# Patient Record
Sex: Female | Born: 1995 | Race: White | Hispanic: No | Marital: Married | Smoking: Never smoker
Health system: Southern US, Community
[De-identification: ages and names within clinical notes are randomized; demographics above are authoritative.]

## PROBLEM LIST (undated history)

## (undated) DIAGNOSIS — R002 Palpitations: Secondary | ICD-10-CM

## (undated) DIAGNOSIS — J189 Pneumonia, unspecified organism: Secondary | ICD-10-CM

## (undated) DIAGNOSIS — R0789 Other chest pain: Secondary | ICD-10-CM

## (undated) HISTORY — DX: Palpitations: R00.2

## (undated) HISTORY — PX: EYE SURGERY: SHX253

## (undated) HISTORY — DX: Other chest pain: R07.89

---

## 2016-12-05 ENCOUNTER — Emergency Department (HOSPITAL_BASED_OUTPATIENT_CLINIC_OR_DEPARTMENT_OTHER): Payer: Self-pay

## 2016-12-05 ENCOUNTER — Emergency Department (HOSPITAL_BASED_OUTPATIENT_CLINIC_OR_DEPARTMENT_OTHER)
Admission: EM | Admit: 2016-12-05 | Discharge: 2016-12-05 | Disposition: A | Discharge status: Home / Self Care | Payer: Self-pay | Attending: Emergency Medicine | Admitting: Emergency Medicine

## 2016-12-05 ENCOUNTER — Encounter (HOSPITAL_BASED_OUTPATIENT_CLINIC_OR_DEPARTMENT_OTHER): Payer: Self-pay | Admitting: Emergency Medicine

## 2016-12-05 DIAGNOSIS — R0789 Other chest pain: Secondary | ICD-10-CM | POA: Insufficient documentation

## 2016-12-05 DIAGNOSIS — M79605 Pain in left leg: Secondary | ICD-10-CM

## 2016-12-05 DIAGNOSIS — M79604 Pain in right leg: Secondary | ICD-10-CM

## 2016-12-05 DIAGNOSIS — M79662 Pain in left lower leg: Secondary | ICD-10-CM | POA: Insufficient documentation

## 2016-12-05 DIAGNOSIS — R0602 Shortness of breath: Secondary | ICD-10-CM | POA: Insufficient documentation

## 2016-12-05 DIAGNOSIS — F17200 Nicotine dependence, unspecified, uncomplicated: Secondary | ICD-10-CM | POA: Insufficient documentation

## 2016-12-05 DIAGNOSIS — M79661 Pain in right lower leg: Secondary | ICD-10-CM | POA: Insufficient documentation

## 2016-12-05 LAB — CBC WITH DIFFERENTIAL/PLATELET
Basophils Absolute: 0 10*3/uL (ref 0.0–0.1)
Basophils Relative: 0 %
EOS ABS: 0.2 10*3/uL (ref 0.0–0.7)
EOS PCT: 3 %
HCT: 38.7 % (ref 36.0–46.0)
Hemoglobin: 13.5 g/dL (ref 12.0–15.0)
LYMPHS ABS: 2.9 10*3/uL (ref 0.7–4.0)
Lymphocytes Relative: 36 %
MCH: 29.5 pg (ref 26.0–34.0)
MCHC: 34.9 g/dL (ref 30.0–36.0)
MCV: 84.5 fL (ref 78.0–100.0)
MONO ABS: 0.5 10*3/uL (ref 0.1–1.0)
MONOS PCT: 6 %
Neutro Abs: 4.5 10*3/uL (ref 1.7–7.7)
Neutrophils Relative %: 55 %
PLATELETS: 266 10*3/uL (ref 150–400)
RBC: 4.58 MIL/uL (ref 3.87–5.11)
RDW: 13 % (ref 11.5–15.5)
WBC: 8.1 10*3/uL (ref 4.0–10.5)

## 2016-12-05 LAB — COMPREHENSIVE METABOLIC PANEL
ALT: 18 U/L (ref 14–54)
ANION GAP: 7 (ref 5–15)
AST: 23 U/L (ref 15–41)
Albumin: 4 g/dL (ref 3.5–5.0)
Alkaline Phosphatase: 48 U/L (ref 38–126)
BUN: 10 mg/dL (ref 6–20)
CHLORIDE: 106 mmol/L (ref 101–111)
CO2: 24 mmol/L (ref 22–32)
Calcium: 8.9 mg/dL (ref 8.9–10.3)
Creatinine, Ser: 0.61 mg/dL (ref 0.44–1.00)
Glucose, Bld: 109 mg/dL — ABNORMAL HIGH (ref 65–99)
Potassium: 3.6 mmol/L (ref 3.5–5.1)
SODIUM: 137 mmol/L (ref 135–145)
Total Bilirubin: 0.5 mg/dL (ref 0.3–1.2)
Total Protein: 7.1 g/dL (ref 6.5–8.1)

## 2016-12-05 NOTE — ED Provider Notes (Signed)
[facility] Provider Note   CSN: [id number] Arrival date & time: 12/05/16  1335     History   Chief Complaint Chief Complaint  Patient presents with  . Foot Swelling    HPI Crystal Hanson is a 21 y.o. female.  The history is provided by the patient.  Leg Pain   This is a chronic problem. Episode onset: 9 months. The problem occurs constantly. Progression since onset: fluctuating. The pain is present in the right lower leg and left lower leg. The quality of the pain is described as aching. Pain severity now: mild to moderate. Pertinent negatives include no numbness, full range of motion, no stiffness, no tingling and no itching. Associated symptoms comments: Intermittent swelling.   Patient also reporting having intermittent chest pain that she's had for many years. She reports her last chest pain was 3 days ago. These episodes are brief in nature with sharp left lateral chest pain that is nonradiating. Patient also described intermittent dyspnea on exertion for the past several months. No recent fevers, illnesses, infections.  Patient is 9 months postpartum. She denied any prenatal or postnatal complications.  History reviewed. No pertinent past medical history.  There are no active problems to display for this patient.   History reviewed. No pertinent surgical history.  OB History    No data available       Home Medications    Prior to Admission medications   Not on File    Family History History reviewed. No pertinent family history.  Social History Social History  Substance Use Topics  . Smoking status: Current Every Day Smoker  . Smokeless tobacco: Never Used  . Alcohol use No     Comment: rarely     Allergies   Patient has no known allergies.   Review of Systems Review of Systems  Musculoskeletal: Negative for stiffness.  Skin: Negative for itching.  Neurological: Negative for tingling and numbness.  All other systems are reviewed and  are negative for acute change except as noted in the HPI    Physical Exam Updated Vital Signs BP (!) 125/103 (BP Location: Right Arm)   Pulse 88   Temp 98.7 F (37.1 C) (Oral)   Resp 18   Ht 5\' 5"  (1.651 m)   Wt 74.8 kg (165 lb)   SpO2 94%   BMI 27.46 kg/m   Physical Exam  Constitutional: She is oriented to person, place, and time. She appears well-developed and well-nourished. No distress.  HENT:  Head: Normocephalic and atraumatic.  Nose: Nose normal.  Eyes: Conjunctivae and EOM are normal. Pupils are equal, round, and reactive to light. Right eye exhibits no discharge. Left eye exhibits no discharge. No scleral icterus.  Neck: Normal range of motion. Neck supple.  Cardiovascular: Normal rate and regular rhythm.  Exam reveals no gallop and no friction rub.   No murmur heard. Pulmonary/Chest: Effort normal and breath sounds normal. No stridor. No respiratory distress. She has no rales.  Abdominal: Soft. She exhibits no distension. There is no tenderness.  Musculoskeletal: She exhibits no edema or tenderness.  No edema, swelling, or erythema  Neurological: She is alert and oriented to person, place, and time.  Skin: Skin is warm and dry. No rash noted. She is not diaphoretic. No erythema.  Psychiatric: She has a normal mood and affect.  Vitals reviewed.    ED Treatments / Results  Labs (all labs ordered are listed, but only abnormal results are displayed) Labs Reviewed  COMPREHENSIVE  METABOLIC PANEL - Abnormal; Notable for the following:       Result Value   Glucose, Bld 109 (*)    All other components within normal limits  CBC WITH DIFFERENTIAL/PLATELET    EKG  EKG Interpretation  Date/Time:  Tuesday December 05 2016 13:49:22 EDT Ventricular Rate:  87 PR Interval:    QRS Duration: 94 QT Interval:  342 QTC Calculation: 412 R Axis:   52 Text Interpretation:  Sinus rhythm Low voltage, precordial leads Nonspecific T abnormalities, diffuse leads Baseline wander in  lead(s) III aVL No old tracing to compare NO STEMI Confirmed by Drema Pryardama, Rayaan Garguilo [phone]([id number]) on 12/05/2016 1:51:58 PM       Radiology Dg Chest 2 View  Result Date: 12/05/2016 CLINICAL DATA:  Shortness of breath several months. EXAM: CHEST  2 VIEW COMPARISON:  None. FINDINGS: The heart size and mediastinal contours are within normal limits. Both lungs are clear. The visualized skeletal structures are unremarkable. IMPRESSION: No active cardiopulmonary disease. Electronically Signed   By: Elberta Fortisaniel  Boyle M.D.   On: 12/05/2016 14:39    Procedures Procedures (including critical care time)  Medications Ordered in ED Medications - No data to display   Initial Impression / Assessment and Plan / ED Course  I have reviewed the triage vital signs and the nursing notes.  Pertinent labs & imaging results that were available during my care of the patient were reviewed by me and considered in my medical decision making (see chart for details).     1. Leg pain. Pain typically encompasses the entire lower extremity. No evidence to suggest cellulitis or DVT. Labs without evidence of electrolyte derangements. Low suspicion for arterial occlusion. Possibly secondary to sacroiliac syndrome from pregnancy ligament laxity.  2. Chest pain/DOE Highly atypical and inconsistent with ACS. EKG nonspecific T-wave changes. Do not feel that further cardiac work up is necessary at this time.   Low pretest probability for pulmonary embolism. PERC negative. Doubt PE.  Chest x-ray without evidence suggestive of pneumonia, pneumothorax, pneumomediastinum.  No abnormal contour of the mediastinum to suggest dissection. No evidence of acute injuries.   The patient is safe for discharge with strict return precautions.   Final Clinical Impressions(s) / ED Diagnoses   Final diagnoses:  SOB (shortness of breath)  Leg pain, bilateral  Shortness of breath  Atypical chest pain   Disposition: Discharge  Condition: Good  I  have discussed the results, Dx and Tx plan with the patient who expressed understanding and agree(s) with the plan. Discharge instructions discussed at great length. The patient was given strict return precautions who verbalized understanding of the instructions. No further questions at time of discharge.    There are no discharge medications for this patient.   Follow Up: Primary care provider          Nira Connardama, Cristianna Cyr Eduardo, MD 12/05/16 [id number]

## 2016-12-05 NOTE — ED Triage Notes (Signed)
Patient states that she has had intermittent swelling to her bilateral lower extremities and Chest pain intermittently. The patient reports that she is also SOB intermittently

## 2017-03-12 ENCOUNTER — Emergency Department (HOSPITAL_BASED_OUTPATIENT_CLINIC_OR_DEPARTMENT_OTHER): Payer: Self-pay

## 2017-03-12 ENCOUNTER — Encounter (HOSPITAL_BASED_OUTPATIENT_CLINIC_OR_DEPARTMENT_OTHER): Payer: Self-pay

## 2017-03-12 ENCOUNTER — Emergency Department (HOSPITAL_BASED_OUTPATIENT_CLINIC_OR_DEPARTMENT_OTHER)
Admission: EM | Admit: 2017-03-12 | Discharge: 2017-03-12 | Disposition: A | Discharge status: Home / Self Care | Payer: Self-pay | Attending: Emergency Medicine | Admitting: Emergency Medicine

## 2017-03-12 DIAGNOSIS — F17200 Nicotine dependence, unspecified, uncomplicated: Secondary | ICD-10-CM | POA: Insufficient documentation

## 2017-03-12 DIAGNOSIS — J181 Lobar pneumonia, unspecified organism: Secondary | ICD-10-CM | POA: Insufficient documentation

## 2017-03-12 DIAGNOSIS — J189 Pneumonia, unspecified organism: Secondary | ICD-10-CM

## 2017-03-12 MED ORDER — BENZONATATE 100 MG PO CAPS: 100.0000 mg | ORAL_CAPSULE | Freq: Three times a day (TID) | ORAL | 0 refills | Status: DC

## 2017-03-12 MED ORDER — AZITHROMYCIN 250 MG PO TABS: 250.0000 mg | ORAL_TABLET | Freq: Every day | ORAL | 0 refills | Status: DC

## 2017-03-12 MED FILL — AZITHROMYCIN 250 MG TABLET: 250 | 5 days supply | Qty: 6 | Fill #0

## 2017-03-12 MED FILL — BENZONATATE 100 MG CAP: 100 | 7 days supply | Qty: 21 | Fill #0

## 2017-03-12 NOTE — Discharge Instructions (Signed)
Please read attached information. If you experience any new or worsening signs or symptoms please return to the emergency room for evaluation. Please follow-up with your primary care provider or specialist as discussed. Please use medication prescribed only as directed and discontinue taking if you have any concerning signs or symptoms.   °

## 2017-03-12 NOTE — ED Provider Notes (Signed)
[facility]   CSN: [id number] Arrival date & time: 03/12/17  1213     History   Chief Complaint Chief Complaint  Patient presents with  . Cough    HPI Crystal Hanson is a 21 y.o. female.  HPI   21 year old female presents today with complaints of cough.  Patient notes upper respiratory congestion, cough, and fatigue over the last 2 weeks.  Patient notes she is continuing her daily activities including working but notes no improvement in her symptoms.  Patient notes using Mucinex this morning, no other medications.  Patient reports he felt warm yesterday, but did not check her temperature.  She denies any significant past medical history, no chronic health conditions, does not take any daily medications.  She denies any recent significant infections or antibiotic use; no risk factors for hospital-acquired pneumonia.  History reviewed. No pertinent past medical history.  There are no active problems to display for this patient.   History reviewed. No pertinent surgical history.  OB History    No data available     Home Medications    Prior to Admission medications   Medication Sig Start Date End Date Taking? Authorizing Provider  azithromycin (ZITHROMAX) 250 MG tablet Take 1 tablet (250 mg total) by mouth daily. Take first 2 tablets together, then 1 every day until finished. 03/12/17   Ames Hoban, Tinnie Gens, PA-C  benzonatate (TESSALON) 100 MG capsule Take 1 capsule (100 mg total) by mouth every 8 (eight) hours. 03/12/17   Eyvonne Mechanic, PA-C    Family History No family history on file.  Social History Social History  Substance Use Topics  . Smoking status: Current Every Day Smoker  . Smokeless tobacco: Never Used  . Alcohol use No     Comment: rarely     Allergies   Patient has no known allergies.   Review of Systems Review of Systems  All other systems reviewed and are negative.    Physical Exam Updated Vital Signs BP 108/82 (BP  Location: Left Arm)   Pulse 90   Temp 97.9 F (36.6 C) (Oral)   Resp 16   Ht  (1.626 m)   Wt 87.5 kg (193 lb)   LMP 02/19/2017   SpO2 97%   BMI 33.13 kg/m   Physical Exam  Constitutional: She is oriented to person, place, and time. She appears well-developed and well-nourished.  HENT:  Head: Normocephalic and atraumatic.  Eyes: Pupils are equal, round, and reactive to light. Conjunctivae are normal. Right eye exhibits no discharge. Left eye exhibits no discharge. No scleral icterus.  Neck: Normal range of motion. No JVD present. No tracheal deviation present.  Cardiovascular: Normal rate, regular rhythm, normal heart sounds and intact distal pulses.  Exam reveals no gallop and no friction rub.   No murmur heard. Pulmonary/Chest: Effort normal and breath sounds normal. No stridor. No respiratory distress. She has no wheezes. She has no rales. She exhibits no tenderness.  Neurological: She is alert and oriented to person, place, and time. Coordination normal.  Skin: Skin is warm.  Psychiatric: She has a normal mood and affect. Her behavior is normal. Judgment and thought content normal.  Nursing note and vitals reviewed.    ED Treatments / Results  Labs (all labs ordered are listed, but only abnormal results are displayed) Labs Reviewed - No data to display  EKG  EKG Interpretation None       Radiology Dg Chest 2 View  Result Date: 03/12/2017 CLINICAL  DATA:  Cough and chest congestion for the past 3 weeks. Nonsmoker. EXAM: CHEST  2 VIEW COMPARISON:  Chest x-ray of December 05, 2016 FINDINGS: The lungs are adequately inflated. There are increased lung markings in the right lower lobe medially. The left lung is clear. There is no pleural effusion. The heart and pulmonary vascularity are normal. The bony thorax is unremarkable. IMPRESSION: Atelectasis or pneumonia in the right lower lobe. Follow-up radiographs following anticipated antibiotic therapy are recommended if the  patient's symptoms persist. Electronically Signed   By: David  SwazilandJordan M.D.   On: 03/12/2017 12:36    Procedures Procedures (including critical care time)  Medications Ordered in ED Medications - No data to display   Initial Impression / Assessment and Plan / ED Course  I have reviewed the triage vital signs and the nursing notes.  Pertinent labs & imaging results that were available during my care of the patient were reviewed by me and considered in my medical decision making (see chart for details).      Final Clinical Impressions(s) / ED Diagnoses   Final diagnoses:  Community acquired pneumonia of right lower lobe of lung (HCC)    Labs:   Imaging: Chest 2 view  Consults:  Therapeutics:  Discharge Meds: Azithromycin  Assessment/Plan: 21 year old female presents today with likely pneumonia.  She is very well-appearing in no acute distress.  She has clear lung sounds, reassuring vital signs.  No signs of severe systemic illness, she is otherwise healthy with no chronic health conditions that would predispose her to her outcome as an outpatient.  Patient will be treated with azithromycin, cough medication, close follow-up with either her primary care or the emergency room if symptoms persist or worsen.  She verbalized understanding and agreement to today's plan had no further questions or concerns.      New Prescriptions Discharge Medication List as of 03/12/2017  1:42 PM    START taking these medications   Details  azithromycin (ZITHROMAX) 250 MG tablet Take 1 tablet (250 mg total) by mouth daily. Take first 2 tablets together, then 1 every day until finished., Starting Mon 03/12/2017, Print    benzonatate (TESSALON) 100 MG capsule Take 1 capsule (100 mg total) by mouth every 8 (eight) hours., Starting Mon 03/12/2017, Print         Aphrodite Harpenau, ShannonJeffrey, PA-C 03/12/17 1528    Gwyneth SproutPlunkett, Whitney, MD 03/12/17 1556

## 2017-03-12 NOTE — ED Triage Notes (Signed)
Pt reports URI symptoms x 2 weeks. Sts productive cough with mucous. Reports sick contacts at home.

## 2017-06-19 ENCOUNTER — Encounter (HOSPITAL_BASED_OUTPATIENT_CLINIC_OR_DEPARTMENT_OTHER): Payer: Self-pay | Admitting: *Deleted

## 2017-06-19 ENCOUNTER — Emergency Department (HOSPITAL_BASED_OUTPATIENT_CLINIC_OR_DEPARTMENT_OTHER)
Admission: EM | Admit: 2017-06-19 | Discharge: 2017-06-19 | Disposition: A | Discharge status: Home / Self Care | Payer: Self-pay | Attending: Emergency Medicine | Admitting: Emergency Medicine

## 2017-06-19 ENCOUNTER — Other Ambulatory Visit: Payer: Self-pay

## 2017-06-19 DIAGNOSIS — R059 Cough, unspecified: Secondary | ICD-10-CM

## 2017-06-19 DIAGNOSIS — F17200 Nicotine dependence, unspecified, uncomplicated: Secondary | ICD-10-CM | POA: Insufficient documentation

## 2017-06-19 DIAGNOSIS — R05 Cough: Secondary | ICD-10-CM | POA: Insufficient documentation

## 2017-06-19 DIAGNOSIS — J0110 Acute frontal sinusitis, unspecified: Secondary | ICD-10-CM | POA: Insufficient documentation

## 2017-06-19 MED ORDER — LEVOFLOXACIN 750 MG PO TABS: 750.0000 mg | ORAL_TABLET | Freq: Every day | ORAL | 0 refills | Status: AC

## 2017-06-19 MED ORDER — FLUTICASONE PROPIONATE 50 MCG/ACT NA SUSP: 1.0000 | Freq: Every day | NASAL | 0 refills | Status: DC

## 2017-06-19 NOTE — ED Provider Notes (Signed)
[facility] EMERGENCY DEPARTMENT Provider Note   CSN: [id number] Arrival date & time: 06/19/17  1518     History   Chief Complaint Chief Complaint  Patient presents with  . Headache  . Cough    HPI Crystal Hanson is a 21 y.o. female presenting with cough and headache.  Patient states that she has been coughing for the past 3 months.  She was seen and diagnosed with pneumonia the beginning of September.  She took some of the antibiotics, had mild improvement of symptoms.  Cough has persisted since.  She reports worsening of symptoms this past week, including increased tiredness, generalized body aches, and headache.  HA is frontal and described as a pressure/throbbing. She reports chills without fever.  She reports the need to take a deep breath more often than normal, and pain in her chest only when she coughs.  She states she feels similar to how she did 3 months ago.  She denies eye pain, ear pain, sore throat, nausea, vomiting, abdominal pain, urinary symptoms, normal bowel movements.  She denies leg pain or swelling.  She denies history of cancer or DVT.  She is not on OCPs.  She denies recent travel, immobilization, or surgery.  She has no medical history, does not take medications daily.  She denies sick contacts.    HPI  History reviewed. No pertinent past medical history.  There are no active problems to display for this patient.   History reviewed. No pertinent surgical history.  OB History    No data available       Home Medications    Prior to Admission medications   Medication Sig Start Date End Date Taking? Authorizing Provider  azithromycin (ZITHROMAX) 250 MG tablet Take 1 tablet (250 mg total) by mouth daily. Take first 2 tablets together, then 1 every day until finished. 03/12/17   Hedges, Tinnie GensJeffrey, PA-C  benzonatate (TESSALON) 100 MG capsule Take 1 capsule (100 mg total) by mouth every 8 (eight) hours. 03/12/17   Hedges, Tinnie GensJeffrey, PA-C  fluticasone  (FLONASE) 50 MCG/ACT nasal spray Place 1 spray into both nostrils daily. 06/19/17   Trevionne Advani, PA-C  levofloxacin (LEVAQUIN) 750 MG tablet Take 1 tablet (750 mg total) by mouth daily for 7 days. 06/19/17 06/26/17  Stephnie Parlier, PA-C    Family History No family history on file.  Social History Social History   Tobacco Use  . Smoking status: Current Every Day Smoker  . Smokeless tobacco: Never Used  Substance Use Topics  . Alcohol use: No    Comment: rarely  . Drug use: No     Allergies   Patient has no known allergies.   Review of Systems Review of Systems  Constitutional: Positive for chills. Negative for fever.  HENT: Negative for sore throat.   Respiratory: Positive for cough.   Cardiovascular: Positive for chest pain (with coughing). Negative for palpitations and leg swelling.  Gastrointestinal: Negative for abdominal pain, nausea and vomiting.  Neurological: Positive for headaches.     Physical Exam Updated Vital Signs BP 105/72 (BP Location: Right Arm)   Pulse (!) 54   Temp 98.2 F (36.8 C) (Oral)   Resp 16   Ht 5\' 5"  (1.651 m)   Wt 74.8 kg (165 lb)   SpO2 100%   BMI 27.46 kg/m   Physical Exam  Constitutional: She is oriented to person, place, and time. She appears well-developed and well-nourished. No distress.  HENT:  Head: Normocephalic and atraumatic.  Right  Ear: Tympanic membrane, external ear and ear canal normal.  Left Ear: Tympanic membrane, external ear and ear canal normal.  Nose: Mucosal edema present. Right sinus exhibits frontal sinus tenderness. Right sinus exhibits no maxillary sinus tenderness. Left sinus exhibits frontal sinus tenderness. Left sinus exhibits no maxillary sinus tenderness.  Mouth/Throat: Uvula is midline, oropharynx is clear and moist and mucous membranes are normal. No tonsillar exudate.  Nasal mucosal edema.  Tenderness to palpation of frontal sinuses.  OP clear without tonsillar swelling or exudate.  Eyes:  Conjunctivae and EOM are normal. Pupils are equal, round, and reactive to light.  Neck: Normal range of motion.  Cardiovascular: Normal rate, regular rhythm and intact distal pulses.  Pulmonary/Chest: Effort normal and breath sounds normal. No respiratory distress. She has no decreased breath sounds. She has no wheezes. She has no rhonchi. She has no rales. She exhibits tenderness (Tenderness palpation of anterior chest wall).  Pt speaking in full sentences.  Clear lung sounds in all fields  Abdominal: Soft. She exhibits no distension. There is no tenderness.  Musculoskeletal: Normal range of motion.  No leg pain or swelling  Lymphadenopathy:    She has no cervical adenopathy.  Neurological: She is alert and oriented to person, place, and time. No sensory deficit. GCS eye subscore is 4. GCS verbal subscore is 5. GCS motor subscore is 6.  Skin: Skin is warm.  Psychiatric: She has a normal mood and affect.  Nursing note and vitals reviewed.    ED Treatments / Results  Labs (all labs ordered are listed, but only abnormal results are displayed) Labs Reviewed - No data to display  EKG  EKG Interpretation None       Radiology No results found.  Procedures Procedures (including critical care time)  Medications Ordered in ED Medications - No data to display   Initial Impression / Assessment and Plan / ED Course  I have reviewed the triage vital signs and the nursing notes.  Pertinent labs & imaging results that were available during my care of the patient were reviewed by me and considered in my medical decision making (see chart for details).     Patient presenting with 3 month h/o cough and worsening sxs. Physical exam reassuring, patient is afebrile and appears nontoxic.  Pulmonary exam reassuring.  Likely sinusitis, however as pt has had cough for 3 months with initial dx of PNA, will cover for PNA. Discussed option of getting cxr today for definitive dx, and pt elects not  to get one today. Will treat with abx and flonase. As she had recent abx and I am covering for both infections, will give levofloxacin. Pt to follow-up with primary care as needed.  At this time, patient appears safe for discharge.  Return precautions given.  Patient states she understands and agrees to plan.   Final Clinical Impressions(s) / ED Diagnoses   Final diagnoses:  Acute frontal sinusitis, recurrence not specified  Cough    ED Discharge Orders        Ordered    levofloxacin (LEVAQUIN) 750 MG tablet  Daily     06/19/17 1750    fluticasone (FLONASE) 50 MCG/ACT nasal spray  Daily     06/19/17 1750       Trampus Mcquerry, PA-C 06/20/17 0142    Melene PlanFloyd, Dan, DO 06/21/17 1545

## 2017-06-19 NOTE — Discharge Instructions (Signed)
You likely have a bacterial infection. We are treating for sinusitis and pneumonia.  Take antibiotics as prescribed. Take the entire course of antibiotics, even if your symptoms improve.  Use Tylenol or ibuprofen as needed for fevers or body aches. Use Flonase daily for nasal congestion and cough. Make sure you stay well-hydrated with water. Wash your hands frequently to prevent spread of infection. Follow-up with a primary care doctor in 1 week if your symptoms are not improving. Return to the emergency room if you develop chest pain, difficulty breathing, or any new or worsening symptoms.

## 2017-06-19 NOTE — ED Triage Notes (Signed)
Headache and productive cough.

## 2017-07-04 ENCOUNTER — Other Ambulatory Visit: Payer: Self-pay

## 2017-07-04 ENCOUNTER — Encounter (HOSPITAL_BASED_OUTPATIENT_CLINIC_OR_DEPARTMENT_OTHER): Payer: Self-pay

## 2017-07-04 ENCOUNTER — Emergency Department (HOSPITAL_BASED_OUTPATIENT_CLINIC_OR_DEPARTMENT_OTHER): Payer: Self-pay

## 2017-07-04 ENCOUNTER — Emergency Department (HOSPITAL_BASED_OUTPATIENT_CLINIC_OR_DEPARTMENT_OTHER)
Admission: EM | Admit: 2017-07-04 | Discharge: 2017-07-04 | Disposition: A | Discharge status: Home / Self Care | Payer: Self-pay | Attending: Emergency Medicine | Admitting: Emergency Medicine

## 2017-07-04 DIAGNOSIS — B9789 Other viral agents as the cause of diseases classified elsewhere: Secondary | ICD-10-CM | POA: Insufficient documentation

## 2017-07-04 DIAGNOSIS — J069 Acute upper respiratory infection, unspecified: Secondary | ICD-10-CM | POA: Insufficient documentation

## 2017-07-04 HISTORY — DX: Pneumonia, unspecified organism: J18.9

## 2017-07-04 NOTE — ED Notes (Signed)
Pt verbalizes understanding of d/c instructions and denies any further needs at this time. 

## 2017-07-04 NOTE — ED Triage Notes (Signed)
C/o flu like sx x 2-3 days-states she has not felt well since PNE in Sept-NAD-steady gait

## 2017-07-04 NOTE — ED Notes (Signed)
ED Provider at bedside. 

## 2017-07-04 NOTE — ED Provider Notes (Signed)
[facility] EMERGENCY DEPARTMENT Provider Note   CSN: [id number] Arrival date & time: 07/04/17  1843     History   Chief Complaint Chief Complaint  Patient presents with  . Cough    HPI Crystal Hanson is a 22 y.o. female.  22yo F who p/w cough, congestion.  Patient reports 2-3 days of upper respiratory infection symptoms including cough, nasal congestion, sore throat, and malaise.  She has been around her friend who had bronchitis last week.  She denies any associated vomiting, fevers, diarrhea, or urinary symptoms.  No rash.  No travel.  She has not felt well since she was treated for pneumonia back in September.  She has taken Alka-Seltzer cold without much relief.   The history is provided by the patient.  Cough     Past Medical History:  Diagnosis Date  . Pneumonia     There are no active problems to display for this patient.   History reviewed. No pertinent surgical history.  OB History    No data available       Home Medications    Prior to Admission medications   Not on File    Family History No family history on file.  Social History Social History   Tobacco Use  . Smoking status: Never Smoker  . Smokeless tobacco: Never Used  Substance Use Topics  . Alcohol use: No  . Drug use: No     Allergies   Patient has no known allergies.   Review of Systems Review of Systems  Respiratory: Positive for cough.    All other systems reviewed and are negative except that which was mentioned in HPI  Physical Exam Updated Vital Signs BP (!) 98/59 (BP Location: Right Arm)   Pulse 74   Temp 98 F (36.7 C) (Oral)   Resp 18   Ht 5\' 5"  (1.651 m)   Wt 77.1 kg (170 lb)   LMP 06/09/2017   SpO2 99%   BMI 28.29 kg/m   Physical Exam  Constitutional: She is oriented to person, place, and time. She appears well-developed and well-nourished. No distress.  HENT:  Head: Normocephalic and atraumatic.  Mouth/Throat: Oropharynx is clear and  moist. No oropharyngeal exudate.  Moist mucous membranes  Eyes: Conjunctivae are normal.  Neck: Normal range of motion. Neck supple.  Cardiovascular: Normal rate, regular rhythm and normal heart sounds.  No murmur heard. Pulmonary/Chest: Effort normal and breath sounds normal.  Abdominal: Soft. Bowel sounds are normal. She exhibits no distension. There is no tenderness.  Musculoskeletal: She exhibits no edema.  Neurological: She is alert and oriented to person, place, and time.  Fluent speech  Skin: Skin is warm and dry.  Psychiatric: She has a normal mood and affect. Judgment normal.  Nursing note and vitals reviewed.    ED Treatments / Results  Labs (all labs ordered are listed, but only abnormal results are displayed) Labs Reviewed - No data to display  EKG  EKG Interpretation None       Radiology Dg Chest 2 View  Result Date: 07/04/2017 CLINICAL DATA:  Productive cough, shortness of breath, stabbing LEFT side chest pain, headache and congestion for 1 week, had pneumonia in September EXAM: CHEST  2 VIEW COMPARISON:  03/12/2017 FINDINGS: Normal heart size, mediastinal contours, and pulmonary vascularity. Lungs clear. No pleural effusion or pneumothorax. Bones unremarkable. IMPRESSION: Normal exam. Electronically Signed   By: Ulyses SouthwardMark  Boles M.D.   On: 07/04/2017 19:21    Procedures Procedures (including  critical care time)  Medications Ordered in ED Medications - No data to display   Initial Impression / Assessment and Plan / ED Course  I have reviewed the triage vital signs and the nursing notes.  Pertinent imaging results that were available during my care of the patient were reviewed by me and considered in my medical decision making (see chart for details).     Sx c/w viral URI. VS reassuring, normal O2 sat, normal CXR. Clear breath sounds. Discussed supportive measures and return precautions.  Final Clinical Impressions(s) / ED Diagnoses   Final diagnoses:    Viral URI with cough    ED Discharge Orders    None       Little, Ambrose Finland, MD 07/04/17 2120

## 2017-10-24 DIAGNOSIS — W57XXXA Bitten or stung by nonvenomous insect and other nonvenomous arthropods, initial encounter: Secondary | ICD-10-CM | POA: Insufficient documentation

## 2017-10-24 DIAGNOSIS — Y929 Unspecified place or not applicable: Secondary | ICD-10-CM | POA: Insufficient documentation

## 2017-10-24 DIAGNOSIS — Y999 Unspecified external cause status: Secondary | ICD-10-CM | POA: Insufficient documentation

## 2017-10-24 DIAGNOSIS — S70361A Insect bite (nonvenomous), right thigh, initial encounter: Secondary | ICD-10-CM | POA: Insufficient documentation

## 2017-10-24 DIAGNOSIS — Y939 Activity, unspecified: Secondary | ICD-10-CM | POA: Insufficient documentation

## 2017-10-25 ENCOUNTER — Other Ambulatory Visit: Payer: Self-pay

## 2017-10-25 ENCOUNTER — Encounter (HOSPITAL_BASED_OUTPATIENT_CLINIC_OR_DEPARTMENT_OTHER): Payer: Self-pay | Admitting: *Deleted

## 2017-10-25 ENCOUNTER — Emergency Department (HOSPITAL_BASED_OUTPATIENT_CLINIC_OR_DEPARTMENT_OTHER)
Admission: EM | Admit: 2017-10-25 | Discharge: 2017-10-25 | Disposition: A | Discharge status: Home / Self Care | Payer: Self-pay | Attending: Emergency Medicine | Admitting: Emergency Medicine

## 2017-10-25 DIAGNOSIS — W57XXXA Bitten or stung by nonvenomous insect and other nonvenomous arthropods, initial encounter: Secondary | ICD-10-CM

## 2017-10-25 DIAGNOSIS — S70361A Insect bite (nonvenomous), right thigh, initial encounter: Secondary | ICD-10-CM

## 2017-10-25 MED ORDER — DOXYCYCLINE HYCLATE 100 MG PO CAPS: 100.0000 mg | ORAL_CAPSULE | Freq: Two times a day (BID) | ORAL | 0 refills | Status: DC

## 2017-10-25 MED ORDER — DOXYCYCLINE HYCLATE 100 MG PO TABS
100.0000 mg | ORAL_TABLET | Freq: Once | ORAL | Status: AC
  Administered 2017-10-25: 100 mg via ORAL
  Filled 2017-10-25: qty 1

## 2017-10-25 NOTE — ED Provider Notes (Signed)
   [facility] Provider Note: Lowella DellJ. Lane Ned Kakar, MD, FACEP  CSN: [id number] MRN: [id number] ARRIVAL: 10/24/17 at 2349 ROOM: MH10/MH10   CHIEF COMPLAINT  Insect Bite   HISTORY OF PRESENT ILLNESS  10/25/17 1:58 AM Crystal Hanson is a 22 y.o. female who thinks she was bitten on her anterior right thigh by an insect 4 days ago.  Symptoms began with itching and burning.  She now has a central erythematous area with surrounding ecchymosis.  There is moderate pain and tenderness associated with it.  She had a low-grade fever earlier in the week but none recently.  She denies nausea or vomiting.   Past Medical History:  Diagnosis Date  . Pneumonia     Past Surgical History:  Procedure Laterality Date  . EYE SURGERY      History reviewed. No pertinent family history.  Social History   Tobacco Use  . Smoking status: Never Smoker  . Smokeless tobacco: Never Used  Substance Use Topics  . Alcohol use: No  . Drug use: No    Prior to Admission medications   Not on File    Allergies Patient has no known allergies.   REVIEW OF SYSTEMS  Negative except as noted here or in the History of Present Illness.   PHYSICAL EXAMINATION  Initial Vital Signs Blood pressure 117/75, pulse 96, temperature 98.2 F (36.8 C), temperature source Oral, resp. rate 18, height 5\' 6"  (1.676 m), weight 94.9 kg (209 lb 4.8 oz), last menstrual period 10/17/2017, SpO2 100 %.  Examination General: Well-developed, well-nourished female in no acute distress; appearance consistent with age of record HENT: normocephalic; atraumatic Eyes: pupils equal, round and reactive to light; extraocular muscles intact Neck: supple Heart: regular rate and rhythm Lungs: clear to auscultation bilaterally Abdomen: soft; nondistended; nontender; bowel sounds present Extremities: No deformity; full range of motion; pulses normal Neurologic: Awake, alert and oriented; motor function intact in all extremities and  symmetric; no facial droop Skin: Warm and dry; erythematous, mildly crusted lesion right anterior thigh with surrounding ecchymosis:     Psychiatric: Normal mood and affect   RESULTS  Summary of this visit's results, reviewed by myself:   EKG Interpretation  Date/Time:    Ventricular Rate:    PR Interval:    QRS Duration:   QT Interval:    QTC Calculation:   R Axis:     Text Interpretation:        Laboratory Studies: No results found for this or any previous visit (from the past 24 hour(s)). Imaging Studies: No results found.  ED COURSE and MDM  Nursing notes and initial vitals signs, including pulse oximetry, reviewed.  Vitals:   10/24/17 2355 10/24/17 2356  BP: 117/75   Pulse: 96   Resp: 18   Temp: 98.2 F (36.8 C)   TempSrc: Oral   SpO2: 100%   Weight:  94.9 kg (209 lb 4.8 oz)  Height:  5\' 6"  (1.676 m)   The patient did not see what allegedly bit her.  The patient's lesion is not classic for the erythema chronicum migrans of Lyme disease but is concerning.  We will go ahead and treat for Lyme disease with doxycycline.  This [facility] also [facility].  PROCEDURES    ED DIAGNOSES     ICD-10-CM   1. Insect bite of right thigh, initial encounter S70.361A    [email or web address]        Dresden Ament, MD 10/25/17 [id number]

## 2017-10-25 NOTE — ED Triage Notes (Signed)
Pt c/o insect bite to right upper thigh x 2 days , redness and bruising  noted today

## 2018-08-12 ENCOUNTER — Other Ambulatory Visit: Payer: Self-pay

## 2018-08-12 ENCOUNTER — Encounter (HOSPITAL_BASED_OUTPATIENT_CLINIC_OR_DEPARTMENT_OTHER): Payer: Self-pay

## 2018-08-12 ENCOUNTER — Emergency Department (HOSPITAL_BASED_OUTPATIENT_CLINIC_OR_DEPARTMENT_OTHER)
Admission: EM | Admit: 2018-08-12 | Discharge: 2018-08-12 | Disposition: A | Discharge status: Home / Self Care | Payer: 59 | Attending: Emergency Medicine | Admitting: Emergency Medicine

## 2018-08-12 DIAGNOSIS — R112 Nausea with vomiting, unspecified: Secondary | ICD-10-CM | POA: Insufficient documentation

## 2018-08-12 DIAGNOSIS — Z3202 Encounter for pregnancy test, result negative: Secondary | ICD-10-CM | POA: Diagnosis not present

## 2018-08-12 DIAGNOSIS — R1084 Generalized abdominal pain: Secondary | ICD-10-CM | POA: Insufficient documentation

## 2018-08-12 DIAGNOSIS — R197 Diarrhea, unspecified: Secondary | ICD-10-CM | POA: Insufficient documentation

## 2018-08-12 LAB — CBC WITH DIFFERENTIAL/PLATELET
Abs Immature Granulocytes: 0.02 10*3/uL (ref 0.00–0.07)
Basophils Absolute: 0 10*3/uL (ref 0.0–0.1)
Basophils Relative: 0 %
EOS ABS: 0.2 10*3/uL (ref 0.0–0.5)
EOS PCT: 3 %
HEMATOCRIT: 42.9 % (ref 36.0–46.0)
Hemoglobin: 13.7 g/dL (ref 12.0–15.0)
Immature Granulocytes: 0 %
LYMPHS ABS: 1.8 10*3/uL (ref 0.7–4.0)
Lymphocytes Relative: 23 %
MCH: 27.5 pg (ref 26.0–34.0)
MCHC: 31.9 g/dL (ref 30.0–36.0)
MCV: 86.1 fL (ref 80.0–100.0)
MONOS PCT: 6 %
Monocytes Absolute: 0.4 10*3/uL (ref 0.1–1.0)
Neutro Abs: 5.1 10*3/uL (ref 1.7–7.7)
Neutrophils Relative %: 68 %
PLATELETS: 308 10*3/uL (ref 150–400)
RBC: 4.98 MIL/uL (ref 3.87–5.11)
RDW: 12.4 % (ref 11.5–15.5)
WBC: 7.6 10*3/uL (ref 4.0–10.5)
nRBC: 0 % (ref 0.0–0.2)

## 2018-08-12 LAB — URINALYSIS, ROUTINE W REFLEX MICROSCOPIC
Bilirubin Urine: NEGATIVE
GLUCOSE, UA: NEGATIVE mg/dL
KETONES UR: NEGATIVE mg/dL
Nitrite: POSITIVE — AB
PROTEIN: NEGATIVE mg/dL
Specific Gravity, Urine: >1.03 — ABNORMAL HIGH (ref 1.005–1.030)
pH: 6 (ref 5.0–8.0)

## 2018-08-12 LAB — COMPREHENSIVE METABOLIC PANEL
ALBUMIN: 3.9 g/dL (ref 3.5–5.0)
ALK PHOS: 63 U/L (ref 38–126)
ALT: 27 U/L (ref 0–44)
ANION GAP: 6 (ref 5–15)
AST: 20 U/L (ref 15–41)
BILIRUBIN TOTAL: 0.6 mg/dL (ref 0.3–1.2)
BUN: 10 mg/dL (ref 6–20)
CALCIUM: 8.6 mg/dL — AB (ref 8.9–10.3)
CO2: 23 mmol/L (ref 22–32)
Chloride: 107 mmol/L (ref 98–111)
Creatinine, Ser: 0.7 mg/dL (ref 0.44–1.00)
GFR calc Af Amer: >60 mL/min (ref >60)
Glucose, Bld: 116 mg/dL — ABNORMAL HIGH (ref 70–99)
Potassium: 3.5 mmol/L (ref 3.5–5.1)
Sodium: 136 mmol/L (ref 135–145)
TOTAL PROTEIN: 7.4 g/dL (ref 6.5–8.1)

## 2018-08-12 LAB — URINALYSIS, MICROSCOPIC (REFLEX)

## 2018-08-12 LAB — PREGNANCY, URINE: PREG TEST UR: NEGATIVE

## 2018-08-12 LAB — LIPASE, BLOOD: Lipase: 27 U/L (ref 11–51)

## 2018-08-12 MED ORDER — SODIUM CHLORIDE 0.9 % IV BOLUS
1000.0000 mL | Freq: Once | INTRAVENOUS | Status: AC
  Administered 2018-08-12: 1000 mL via INTRAVENOUS

## 2018-08-12 MED ORDER — ONDANSETRON HCL 4 MG/2ML IJ SOLN
4.0000 mg | Freq: Once | INTRAMUSCULAR | Status: AC
  Administered 2018-08-12: 4 mg via INTRAVENOUS
  Filled 2018-08-12: qty 2

## 2018-08-12 MED ORDER — ONDANSETRON 4 MG PO TBDP: 4.0000 mg | ORAL_TABLET | Freq: Once | ORAL | Status: DC | PRN

## 2018-08-12 MED ORDER — ONDANSETRON 4 MG PO TBDP: 4.0000 mg | ORAL_TABLET | Freq: Three times a day (TID) | ORAL | 0 refills | Status: AC | PRN

## 2018-08-12 MED ORDER — DICYCLOMINE HCL 20 MG PO TABS: 20.0000 mg | ORAL_TABLET | Freq: Two times a day (BID) | ORAL | 0 refills | Status: AC

## 2018-08-12 NOTE — ED Notes (Signed)
Coke given for po challenge.  

## 2018-08-12 NOTE — Discharge Instructions (Signed)
Nausea, Vomiting, and Diarrhea ° °Hand washing: Wash your hands throughout the day, but especially before and after touching the face, using the restroom, sneezing, coughing, or touching surfaces that have been coughed or sneezed upon. °Hydration: Symptoms will be intensified and complicated by dehydration. Dehydration can also extend the duration of symptoms. Drink plenty of fluids and get plenty of rest. You should be drinking at least half a liter of water an hour to stay hydrated. Electrolyte drinks (ex. Gatorade, Powerade, Pedialyte) are also encouraged. You should be drinking enough fluids to make your urine light yellow, almost clear. If this is not the case, you are not drinking enough water. °Please note that some of the treatments indicated below will not be effective if you are not adequately hydrated. °Diet: Please concentrate on hydration, however, you may introduce food slowly.  Start with a clear liquid diet, progressed to a full liquid diet, and then bland solids as you are able. °Pain or fever: Ibuprofen, Naproxen, or Tylenol for pain or fever.  °Nausea/vomiting: Use the Zofran for nausea or vomiting. °Diarrhea: May use medications such as loperamide (Imodium) or Bismuth subsalicylate (Pepto-Bismol). °Bentyl: This medication is what is known as an antispasmodic and is intended to help reduce abdominal discomfort. °Follow-up: Follow-up with a primary care provider on this matter. °Return: Return should you develop a fever, bloody diarrhea, increased abdominal pain, uncontrolled vomiting, or any other major concerns. ° °For prescription assistance, may try using prescription discount sites or apps, such as goodrx.com °

## 2018-08-12 NOTE — ED Triage Notes (Signed)
Pt arrives via POV. Pt began feeling nauseous last night 19:30. Has been around people with "the stomach bug". Multiple episodes of vomiting. Three episodes of diarrhea this am. Endorses chills.

## 2018-08-12 NOTE — ED Notes (Signed)
ED Provider at bedside. 

## 2018-08-12 NOTE — ED Provider Notes (Signed)
[facility] EMERGENCY DEPARTMENT Provider Note   CSN: [id number] Arrival date & time: 08/12/18  1009     History   Chief Complaint Chief Complaint  Patient presents with  . Nausea  . Emesis    HPI Crystal Hanson is a 23 y.o. female.  HPI  Crystal Hanson is a 23 y.o. female, patient with no pertinent past medical history, presenting to the ED with vomiting beginning this morning around midnight.  Nonbloody and nonbilious.  Preceded by nausea yesterday.  Diarrhea beginning a few hours ago. Mild, generalized abdominal cramping typically prior to a bowel movement. She has had contact with people at work with similar symptoms.  No recent antibiotic use.  No time spent in the outdoors, traveling, or in foreign countries. LMP about 2-3 weeks ago, but she states she has irregular menstrual cycles. Denies fever, persistent abdominal pain, urinary symptoms, abnormal vaginal discharge/bleeding, hematochezia/melena, cough or other upper respiratory symptoms, or any other complaints.   Past Medical History:  Diagnosis Date  . Pneumonia     There are no active problems to display for this patient.   Past Surgical History:  Procedure Laterality Date  . EYE SURGERY       OB History   No obstetric history on file.      Home Medications    Prior to Admission medications   Medication Sig Start Date End Date Taking? Authorizing Provider  dicyclomine (BENTYL) 20 MG tablet Take 1 tablet (20 mg total) by mouth 2 (two) times daily. 08/12/18   Courtland Coppa C, PA-C  ondansetron (ZOFRAN ODT) 4 MG disintegrating tablet Take 1 tablet (4 mg total) by mouth every 8 (eight) hours as needed for nausea or vomiting. 08/12/18   Latravis Grine, Hillard DankerShawn C, PA-C    Family History History reviewed. No pertinent family history.  Social History Social History   Tobacco Use  . Smoking status: Never Smoker  . Smokeless tobacco: Never Used  Substance Use Topics  . Alcohol use: Yes    Comment: socially  .  Drug use: No     Allergies   Patient has no known allergies.   Review of Systems Review of Systems  Constitutional: Negative for chills and fever.  HENT: Negative for congestion.   Respiratory: Negative for cough and shortness of breath.   Gastrointestinal: Positive for diarrhea, nausea and vomiting. Negative for abdominal distention, abdominal pain and blood in stool.  Genitourinary: Negative for dysuria, frequency and hematuria.  Musculoskeletal: Negative for back pain.  Neurological: Negative for syncope and weakness.  All other systems reviewed and are negative.    Physical Exam Updated Vital Signs BP 121/75 (BP Location: Right Arm)   Pulse (!) 106   Temp 98.5 F (36.9 C) (Oral)   Resp 20   Ht 5\' 6"  (1.676 m)   Wt 98.7 kg   SpO2 100%   BMI 35.12 kg/m   Physical Exam Vitals signs and nursing note reviewed.  Constitutional:      General: She is not in acute distress.    Appearance: She is well-developed. She is not diaphoretic.  HENT:     Head: Normocephalic and atraumatic.     Mouth/Throat:     Mouth: Mucous membranes are moist.     Pharynx: Oropharynx is clear.  Eyes:     Conjunctiva/sclera: Conjunctivae normal.  Neck:     Musculoskeletal: Neck supple.  Cardiovascular:     Rate and Rhythm: Normal rate and regular rhythm.     Pulses:  Normal pulses.     Heart sounds: Normal heart sounds.     Comments: Pulse rate about 96 on my exam. Pulmonary:     Effort: Pulmonary effort is normal. No respiratory distress.     Breath sounds: Normal breath sounds.  Abdominal:     Palpations: Abdomen is soft.     Tenderness: There is no abdominal tenderness. There is no guarding.  Musculoskeletal:     Right lower leg: No edema.     Left lower leg: No edema.  Lymphadenopathy:     Cervical: No cervical adenopathy.  Skin:    General: Skin is warm and dry.  Neurological:     Mental Status: She is alert.  Psychiatric:        Mood and Affect: Mood and affect normal.          Speech: Speech normal.        Behavior: Behavior normal.      ED Treatments / Results  Labs (all labs ordered are listed, but only abnormal results are displayed) Labs Reviewed  COMPREHENSIVE METABOLIC PANEL - Abnormal; Notable for the following components:      Result Value   Glucose, Bld 116 (*)    Calcium 8.6 (*)    All other components within normal limits  URINALYSIS, ROUTINE W REFLEX MICROSCOPIC - Abnormal; Notable for the following components:   APPearance CLOUDY (*)    Specific Gravity, Urine >1.030 (*)    Hgb urine dipstick MODERATE (*)    Nitrite POSITIVE (*)    Leukocytes, UA SMALL (*)    All other components within normal limits  URINALYSIS, MICROSCOPIC (REFLEX) - Abnormal; Notable for the following components:   Bacteria, UA MANY (*)    All other components within normal limits  URINE CULTURE  LIPASE, BLOOD  PREGNANCY, URINE  CBC WITH DIFFERENTIAL/PLATELET    EKG None  Radiology No results found.  Procedures Procedures (including critical care time)  Medications Ordered in ED Medications  ondansetron (ZOFRAN-ODT) disintegrating tablet 4 mg (has no administration in time range)  sodium chloride 0.9 % bolus 1,000 mL ( Intravenous Stopped 08/12/18 1220)  ondansetron (ZOFRAN) injection 4 mg (4 mg Intravenous Given 08/12/18 1110)  ondansetron (ZOFRAN) injection 4 mg (4 mg Intravenous Given 08/12/18 1242)  sodium chloride 0.9 % bolus 1,000 mL ( Intravenous Stopped 08/12/18 1347)     Initial Impression / Assessment and Plan / ED Course  I have reviewed the triage vital signs and the nursing notes.  Pertinent labs & imaging results that were available during my care of the patient were reviewed by me and considered in my medical decision making (see chart for details).  Clinical Course as of Aug 13 1403  Mon Aug 12, 2018  1131 Patient denies urinary symptoms.  Urinalysis, Routine w reflex microscopic(!) [SJ]  1236 Patient vomited oral fluid challenge.    [SJ]  1355 RN states patient is now tolerating PO fluids.   [SJ]    Clinical Course User Index [SJ] Essynce Munsch C, PA-C    Patient presents with nausea, vomiting, and diarrhea. Patient is nontoxic appearing, afebrile, not tachycardic on my exam, not tachypneic, not hypotensive, maintains excellent SPO2 on room air, and is in no apparent distress.  No leukocytosis.  Benign abdominal exam.  Lab results overall reassuring.  No indication for imaging at this time. Tolerating PO fluids at discharge. The patient was given instructions for home care as well as return precautions. Patient voices understanding of these instructions,  accepts the plan, and is comfortable with discharge.   Vitals:   08/12/18 1038 08/12/18 1225  BP: 121/75 109/68  Pulse: (!) 106 82  Resp: 20 16  Temp: 98.5 F (36.9 C)   TempSrc: Oral   SpO2: 100% 100%  Weight: 98.7 kg   Height: 5\' 6"  (1.676 m)      Final Clinical Impressions(s) / ED Diagnoses   Final diagnoses:  Nausea vomiting and diarrhea    ED Discharge Orders         Ordered    ondansetron (ZOFRAN ODT) 4 MG disintegrating tablet  Every 8 hours PRN     08/12/18 1058    dicyclomine (BENTYL) 20 MG tablet  2 times daily     08/12/18 [address], PA-C 08/12/18 1406    Benjiman Core, MD 08/12/18 [phone]

## 2018-08-12 NOTE — ED Notes (Signed)
Pt ambulated to RR unassisted 

## 2018-08-12 NOTE — ED Notes (Signed)
ED Provider at bedside discussing test results and dispo plan of care. 

## 2018-08-12 NOTE — ED Notes (Signed)
Sprite given for 2nd PO challenge.

## 2018-08-14 LAB — URINE CULTURE: Culture: >=100000 — AB

## 2018-08-15 ENCOUNTER — Telehealth: Payer: Self-pay | Admitting: *Deleted

## 2018-08-15 ENCOUNTER — Telehealth: Payer: Self-pay

## 2018-08-15 NOTE — Telephone Encounter (Signed)
Post ED Visit - Positive Culture Follow-up: Unsuccessful Patient Follow-up  Culture assessed and recommendations reviewed by:  []  Enzo Bi, Pharm.D. []  Celedonio Miyamoto, Pharm.D., BCPS AQ-ID []  Garvin Fila, Pharm.D., BCPS []  Georgina Pillion, Pharm.D., BCPS []  Coates, [place].D., BCPS, AAHIVP []  Estella Husk, Pharm.D., BCPS, AAHIVP []  Sherlynn Carbon, PharmD []  Pollyann Samples, PharmD, BCPS Mervyn Gay Pharm D Positive urine culture Symptom check  May need abx [x]  Patient discharged without antimicrobial prescription and treatment is now indicated []  Organism is resistant to prescribed ED discharge antimicrobial []  Patient with positive blood cultures   Unable to contact patient after 3 attempts, letter will be sent to address on file  Jerry Caras 08/15/2018, 11:44 AM

## 2018-08-15 NOTE — Progress Notes (Signed)
ED Antimicrobial Stewardship Positive Culture Follow Up   Aribella Naples is an 23 y.o. female who presented to [facility] on 08/12/2018 with a chief complaint of  Chief Complaint  Patient presents with  . Nausea  . Emesis    Recent Results (from the past 720 hour(s))  Urine culture     Status: Abnormal   Collection Time: 08/12/18 11:07 AM  Result Value Ref Range Status   Specimen Description   Final    URINE, CLEAN CATCH Performed at [facility], [address] [place], [place] [address]    Special Requests   Final    NONE Performed at [facility], [address] [place], [place] [address]    Culture >=100,000 COLONIES/mL ESCHERICHIA COLI (A)  Final   Report Status 08/14/2018 FINAL  Final   Organism ID, Bacteria ESCHERICHIA COLI (A)  Final      Susceptibility   Escherichia coli - MIC*    AMPICILLIN >=32 RESISTANT Resistant     CEFAZOLIN <=4 SENSITIVE Sensitive     CEFTRIAXONE <=1 SENSITIVE Sensitive     CIPROFLOXACIN <=0.25 SENSITIVE Sensitive     GENTAMICIN <=1 SENSITIVE Sensitive     IMIPENEM <=0.25 SENSITIVE Sensitive     NITROFURANTOIN <=16 SENSITIVE Sensitive     TRIMETH/SULFA >=320 RESISTANT Resistant     AMPICILLIN/SULBACTAM 16 INTERMEDIATE Intermediate     PIP/TAZO <=4 SENSITIVE Sensitive     Extended ESBL NEGATIVE Sensitive     * >=100,000 COLONIES/mL ESCHERICHIA COLI    ED Provider: Burna Forts, PA-C  PA asked that we call patient for a symptom check.  If still symptomatic then call in a prescription for Keflex 250 mg po bid x 5 days. If asymptomatic then no intervention.  Addendum: Attempted to call patient at [phone] at 0930 on 2/13, but got no answer.   Tera Mater 08/15/2018, 9:46 AM Clinical Pharmacist Monday - Friday phone -  [phone] Saturday - Sunday phone - [phone]

## 2018-09-25 IMAGING — DX DG CHEST 2V
2 series · 2 of 2 positions shown · non-contrast
Comparison: 03/12/2017

CLINICAL DATA: Productive cough, shortness of breath, stabbing LEFT
side chest pain, headache and congestion for 1 week, had pneumonia
in [REDACTED]

EXAM:
CHEST  2 VIEW

[chest pa]
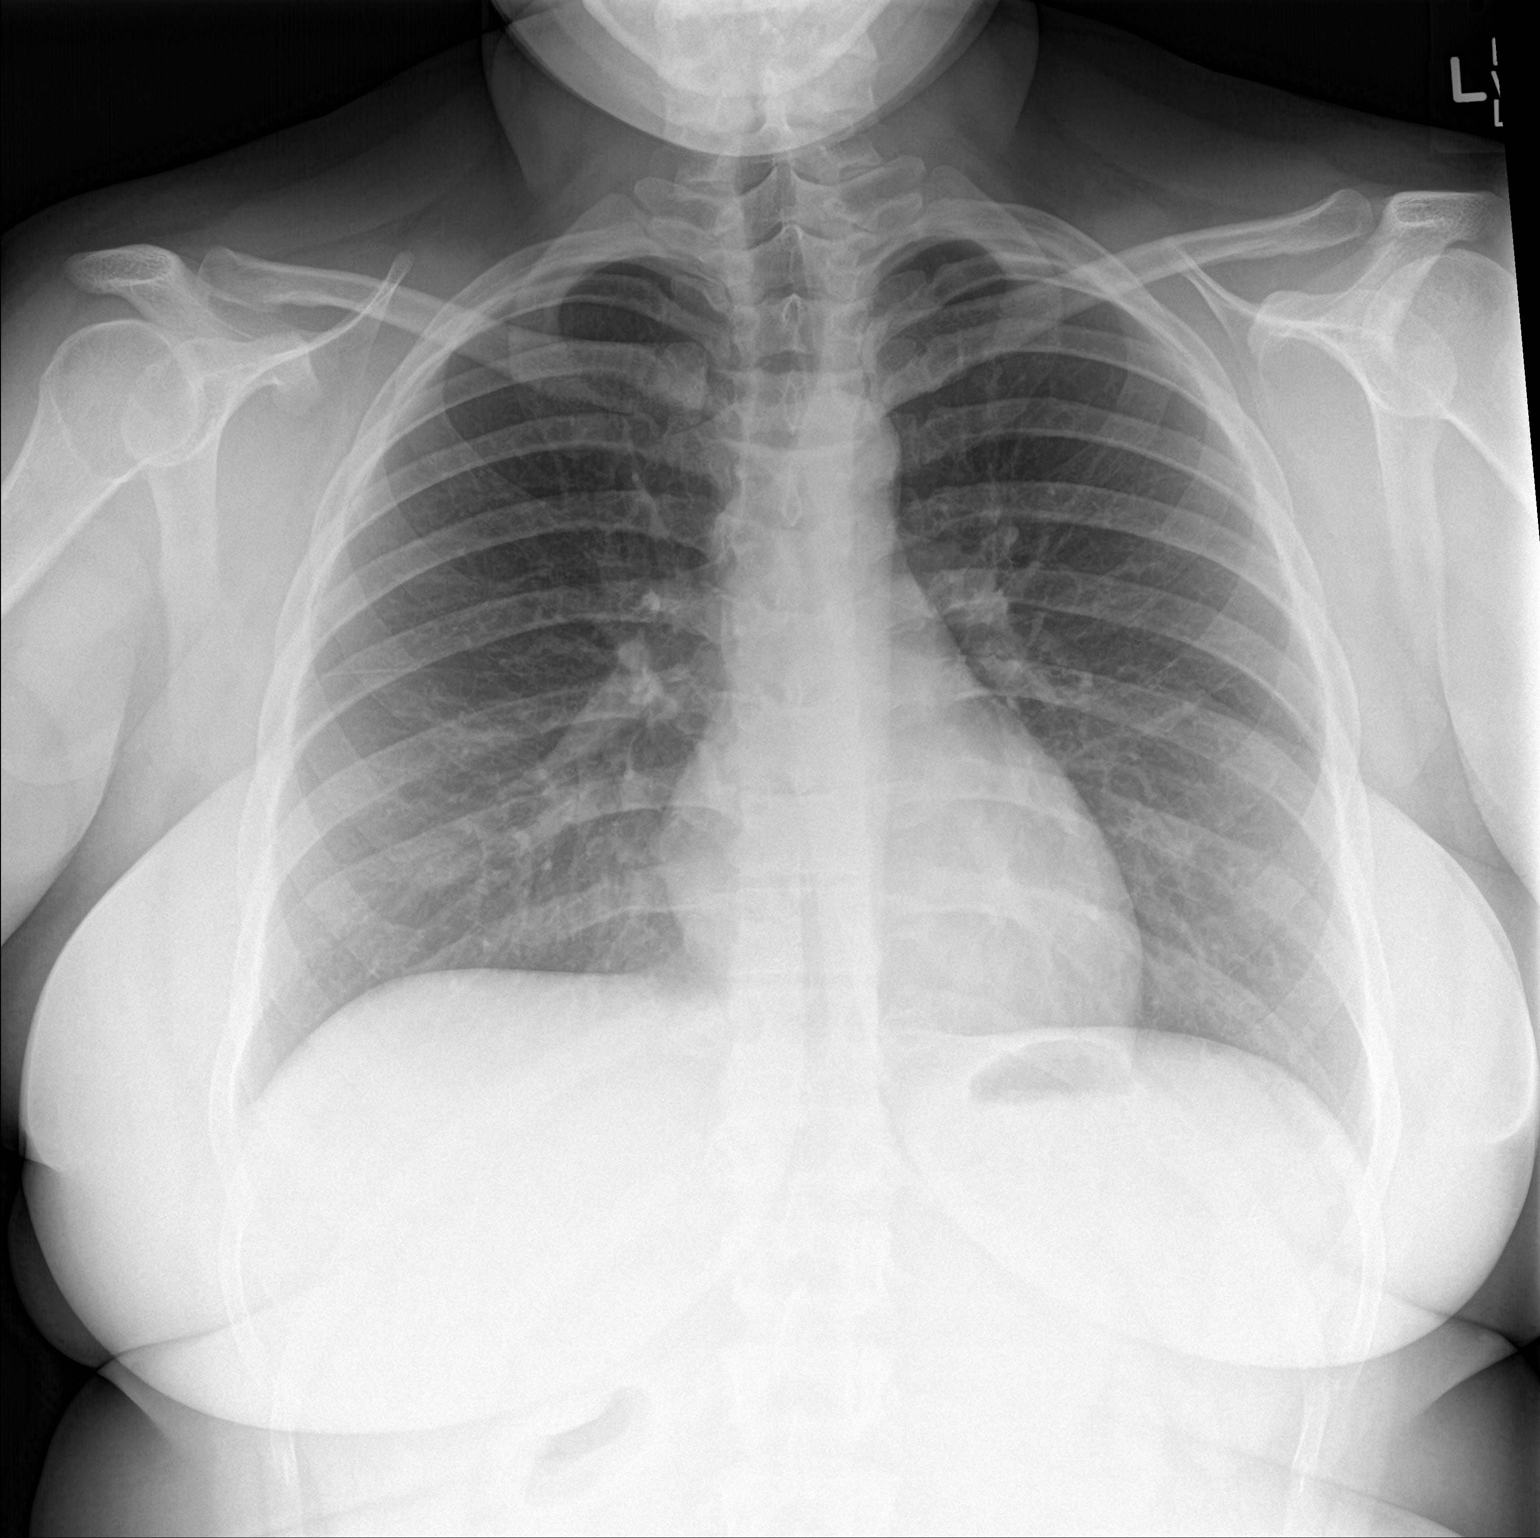

[chest lat]
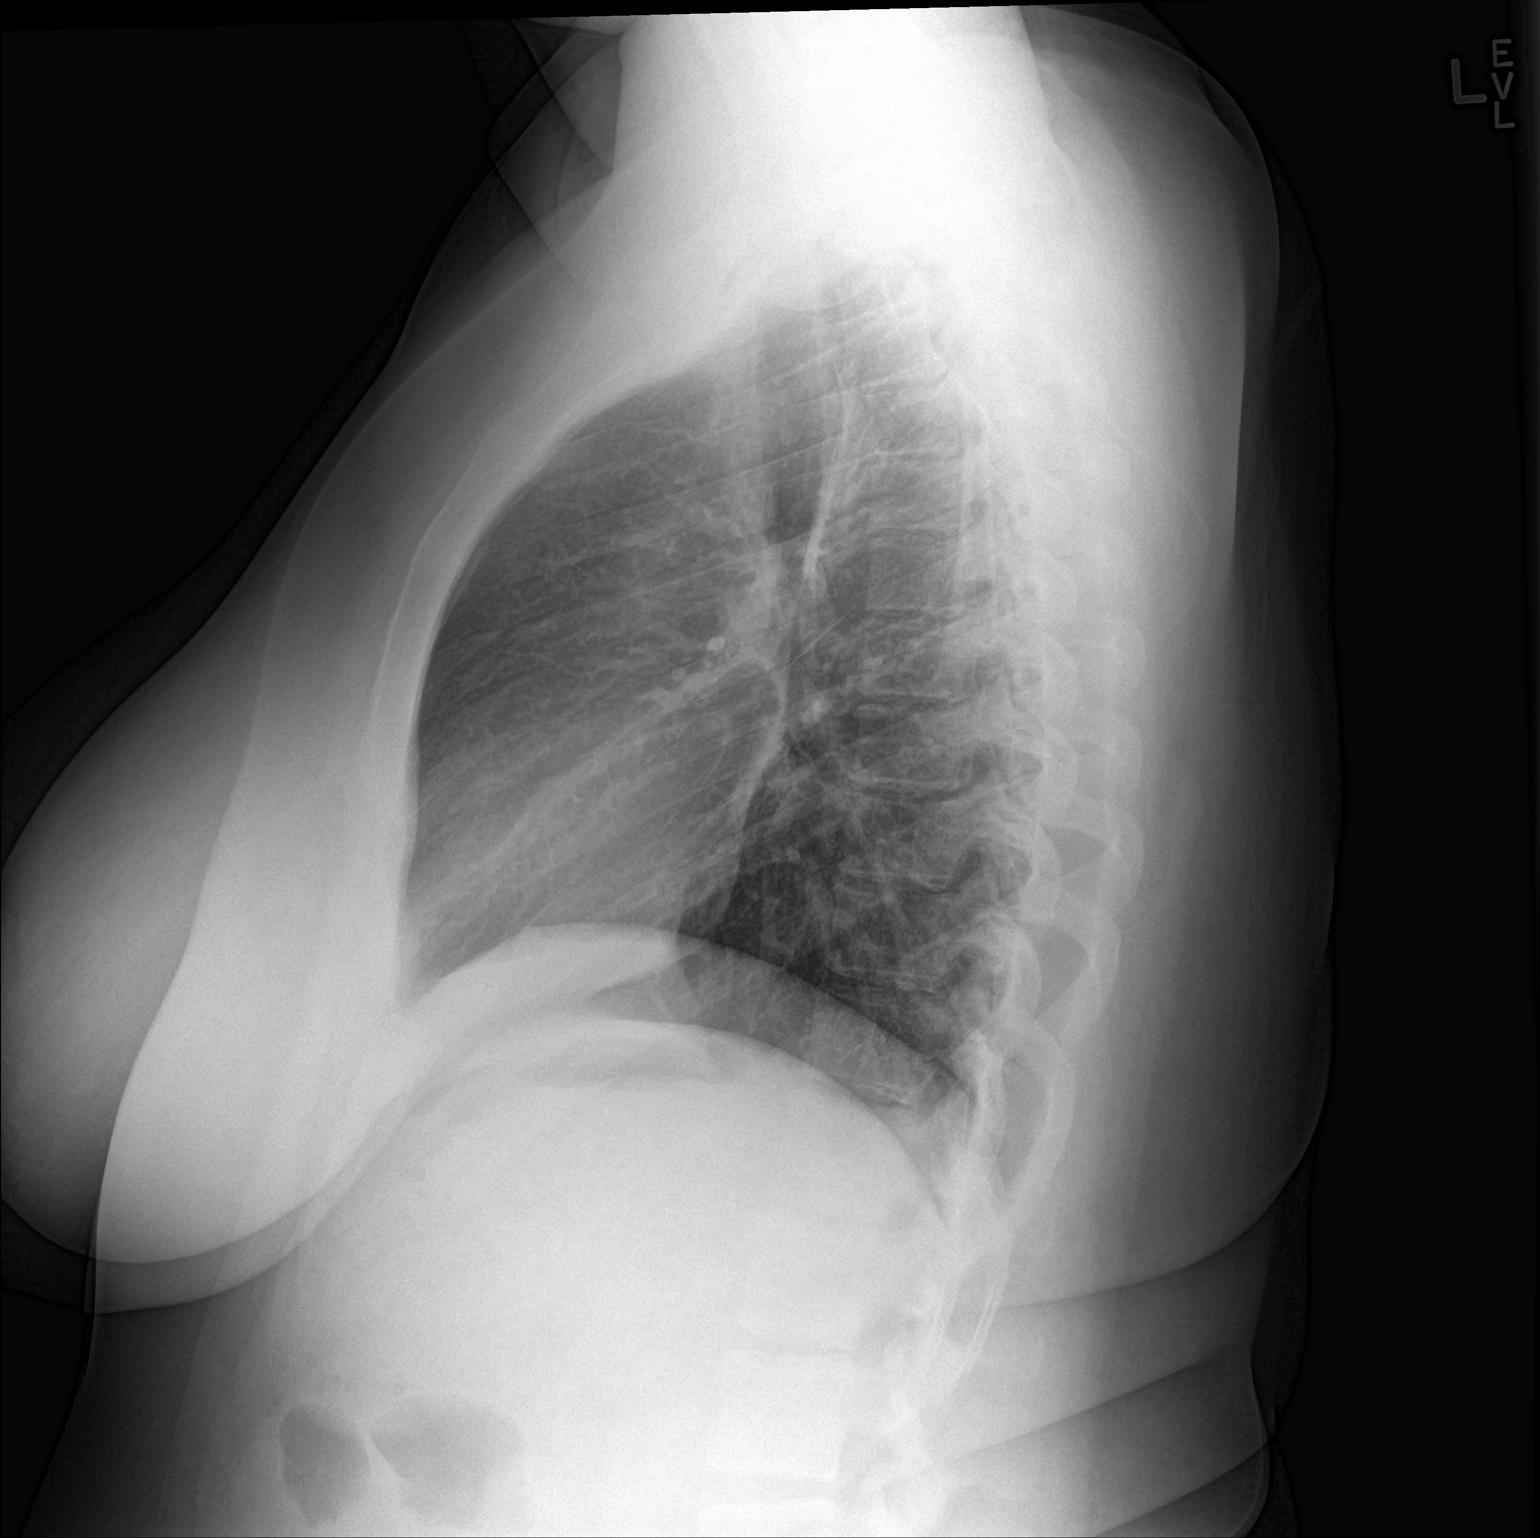

[2 of 2 positions shown; findings below may reference images not displayed]

FINDINGS: Normal heart size, mediastinal contours, and pulmonary vascularity.

Lungs clear.

No pleural effusion or pneumothorax.

Bones unremarkable.
IMPRESSION: Normal exam.

## 2019-04-08 ENCOUNTER — Other Ambulatory Visit: Payer: Self-pay

## 2019-04-08 DIAGNOSIS — Z20822 Contact with and (suspected) exposure to covid-19: Secondary | ICD-10-CM

## 2019-04-10 LAB — NOVEL CORONAVIRUS, NAA: SARS-CoV-2, NAA: NOT DETECTED

## 2019-08-06 IMAGING — CR DG CHEST 2V
2 series · 2 of 2 positions shown · non-contrast
Comparison: Chest x-ray of December 05, 2016

CLINICAL DATA: Cough and chest congestion for the past 3 weeks.
Nonsmoker.

EXAM:
CHEST  2 VIEW

[w chest pa]
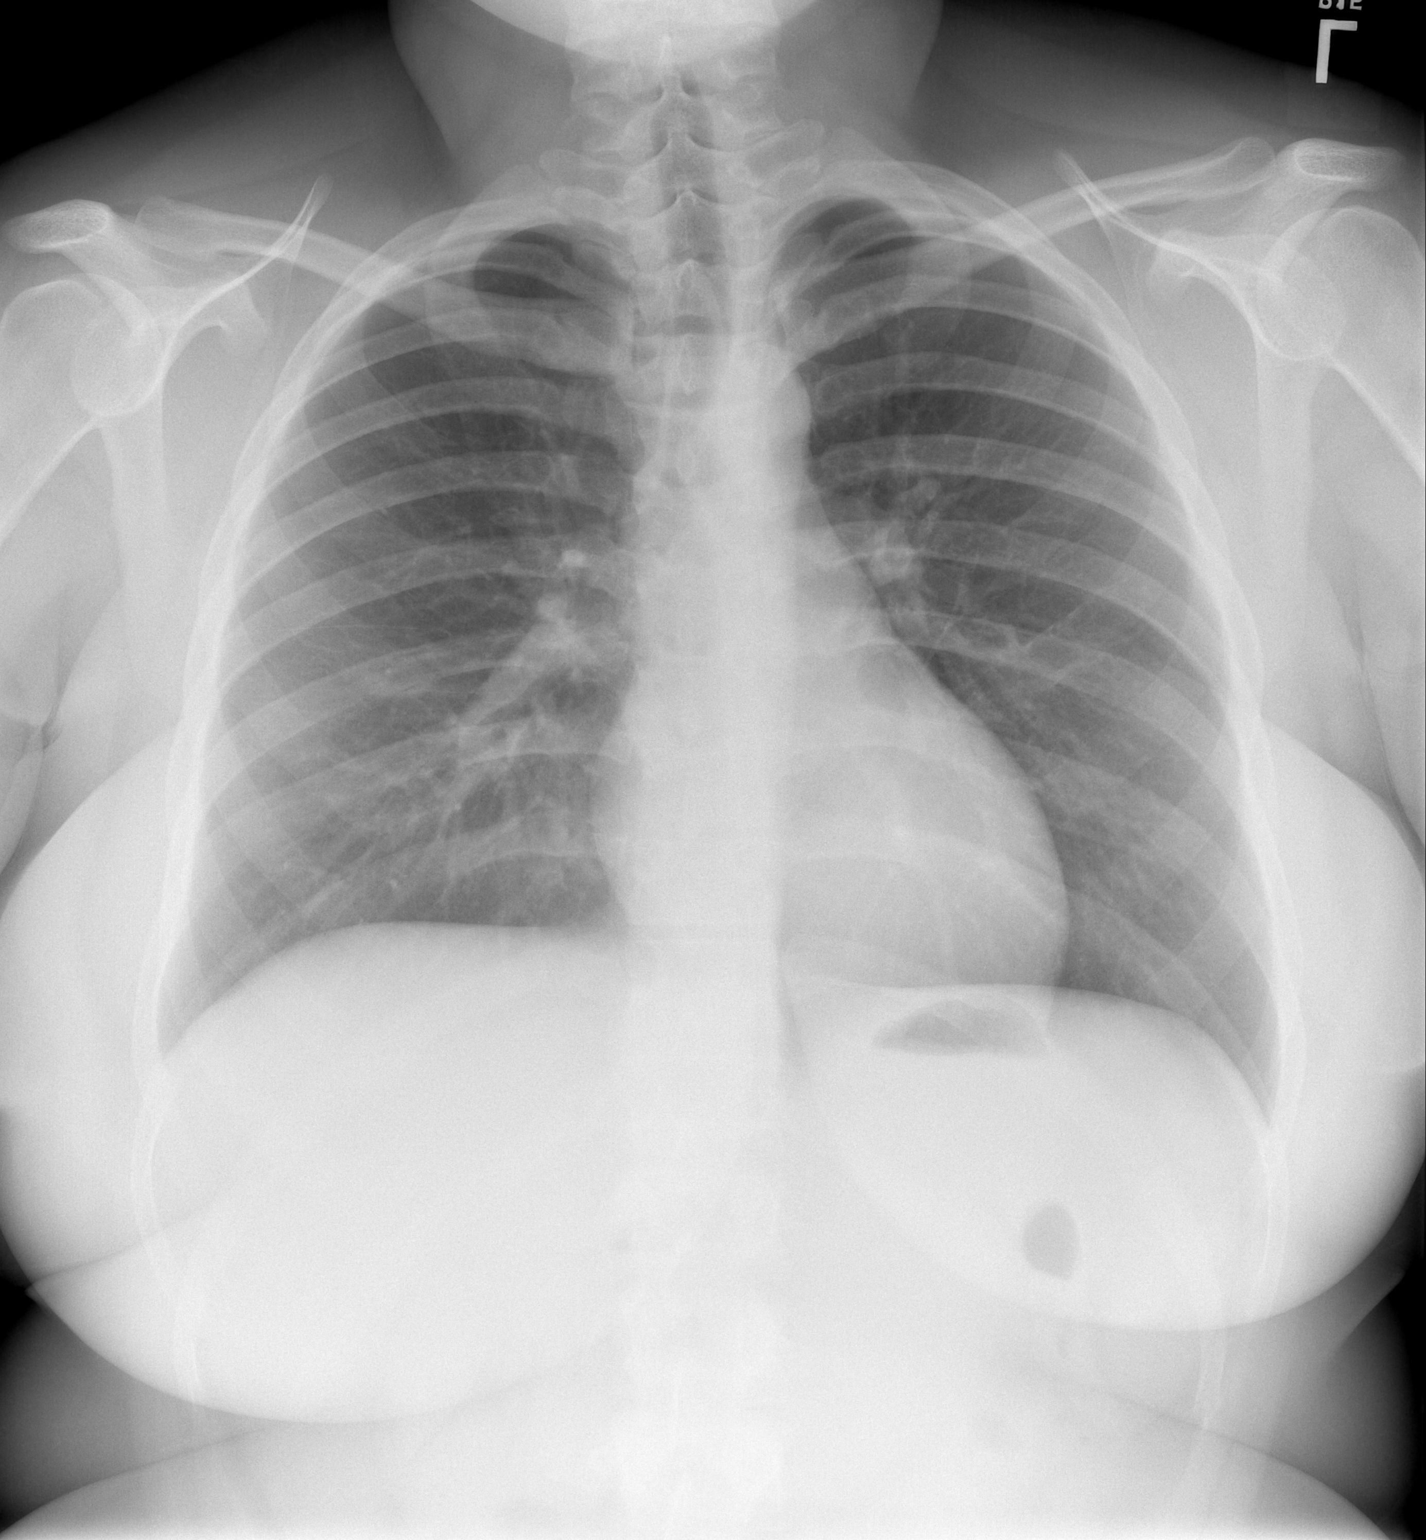

[w chest lat]
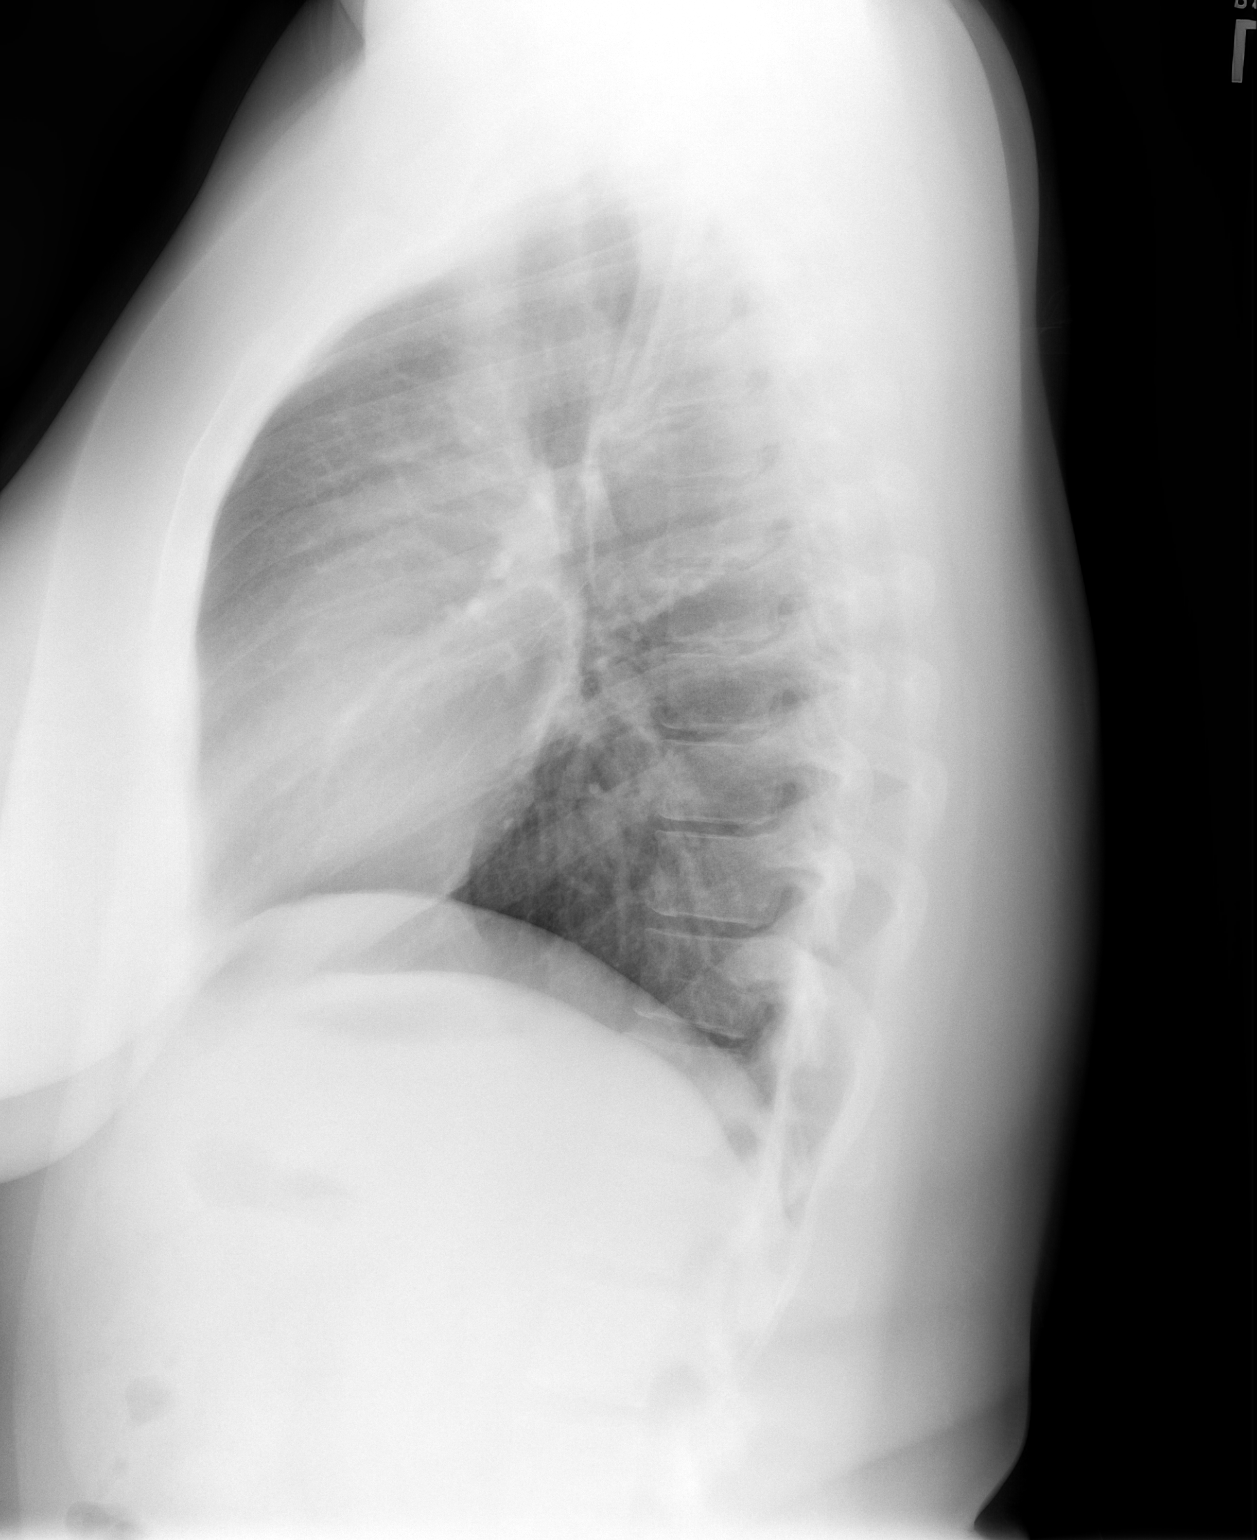

[2 of 2 positions shown; findings below may reference images not displayed]

FINDINGS: The lungs are adequately inflated. There are increased lung markings
in the right lower lobe medially. The left lung is clear. There is
no pleural effusion. The heart and pulmonary vascularity are normal.
The bony thorax is unremarkable.
IMPRESSION: Atelectasis or pneumonia in the right lower lobe. Follow-up
radiographs following anticipated antibiotic therapy are recommended
if the patient's symptoms persist.

## 2023-01-15 ENCOUNTER — Emergency Department (HOSPITAL_BASED_OUTPATIENT_CLINIC_OR_DEPARTMENT_OTHER)
Admission: EM | Admit: 2023-01-15 | Discharge: 2023-01-15 | Disposition: A | Discharge status: Home / Self Care | Payer: Self-pay | Attending: Emergency Medicine | Admitting: Emergency Medicine

## 2023-01-15 ENCOUNTER — Emergency Department (HOSPITAL_BASED_OUTPATIENT_CLINIC_OR_DEPARTMENT_OTHER): Payer: Self-pay

## 2023-01-15 ENCOUNTER — Other Ambulatory Visit: Payer: Self-pay

## 2023-01-15 DIAGNOSIS — R002 Palpitations: Secondary | ICD-10-CM

## 2023-01-15 DIAGNOSIS — R2243 Localized swelling, mass and lump, lower limb, bilateral: Secondary | ICD-10-CM | POA: Insufficient documentation

## 2023-01-15 DIAGNOSIS — E876 Hypokalemia: Secondary | ICD-10-CM | POA: Insufficient documentation

## 2023-01-15 DIAGNOSIS — R0789 Other chest pain: Secondary | ICD-10-CM | POA: Insufficient documentation

## 2023-01-15 DIAGNOSIS — R0602 Shortness of breath: Secondary | ICD-10-CM | POA: Insufficient documentation

## 2023-01-15 LAB — TSH: TSH: 3.28 u[IU]/mL (ref 0.350–4.500)

## 2023-01-15 LAB — T4, FREE: Free T4: 0.69 ng/dL (ref 0.61–1.12)

## 2023-01-15 LAB — CBC WITH DIFFERENTIAL/PLATELET
Abs Immature Granulocytes: 0.04 10*3/uL (ref 0.00–0.07)
Basophils Absolute: 0 10*3/uL (ref 0.0–0.1)
Basophils Relative: 0 %
Eosinophils Absolute: 0.2 10*3/uL (ref 0.0–0.5)
Eosinophils Relative: 2 %
HCT: 40.5 % (ref 36.0–46.0)
Hemoglobin: 14.1 g/dL (ref 12.0–15.0)
Immature Granulocytes: 0 %
Lymphocytes Relative: 35 %
Lymphs Abs: 3.6 10*3/uL (ref 0.7–4.0)
MCH: 29.1 pg (ref 26.0–34.0)
MCHC: 34.8 g/dL (ref 30.0–36.0)
MCV: 83.7 fL (ref 80.0–100.0)
Monocytes Absolute: 0.8 10*3/uL (ref 0.1–1.0)
Monocytes Relative: 7 %
Neutro Abs: 5.6 10*3/uL (ref 1.7–7.7)
Neutrophils Relative %: 56 %
Platelets: 367 10*3/uL (ref 150–400)
RBC: 4.84 MIL/uL (ref 3.87–5.11)
RDW: 12.7 % (ref 11.5–15.5)
WBC: 10.3 10*3/uL (ref 4.0–10.5)
nRBC: 0 % (ref 0.0–0.2)

## 2023-01-15 LAB — COMPREHENSIVE METABOLIC PANEL
ALT: 55 U/L — ABNORMAL HIGH (ref 0–44)
AST: 33 U/L (ref 15–41)
Albumin: 3.9 g/dL (ref 3.5–5.0)
Alkaline Phosphatase: 51 U/L (ref 38–126)
Anion gap: 10 (ref 5–15)
BUN: 10 mg/dL (ref 6–20)
CO2: 21 mmol/L — ABNORMAL LOW (ref 22–32)
Calcium: 9.3 mg/dL (ref 8.9–10.3)
Chloride: 105 mmol/L (ref 98–111)
Creatinine, Ser: 0.81 mg/dL (ref 0.44–1.00)
GFR, Estimated: >60 mL/min (ref >60)
Glucose, Bld: 103 mg/dL — ABNORMAL HIGH (ref 70–99)
Potassium: 3.4 mmol/L — ABNORMAL LOW (ref 3.5–5.1)
Sodium: 136 mmol/L (ref 135–145)
Total Bilirubin: 0.5 mg/dL (ref 0.3–1.2)
Total Protein: 7.3 g/dL (ref 6.5–8.1)

## 2023-01-15 LAB — BRAIN NATRIURETIC PEPTIDE: B Natriuretic Peptide: 53.2 pg/mL (ref 0.0–100.0)

## 2023-01-15 LAB — TROPONIN I (HIGH SENSITIVITY)
Troponin I (High Sensitivity): <2 ng/L (ref <18)
Troponin I (High Sensitivity): <2 ng/L (ref <18)

## 2023-01-15 LAB — D-DIMER, QUANTITATIVE: D-Dimer, Quant: 0.37 ug/mL-FEU (ref 0.00–0.50)

## 2023-01-15 LAB — MAGNESIUM: Magnesium: 1.9 mg/dL (ref 1.7–2.4)

## 2023-01-15 LAB — PREGNANCY, URINE: Preg Test, Ur: NEGATIVE

## 2023-01-15 MED ORDER — IBUPROFEN 800 MG PO TABS: 800.0000 mg | ORAL_TABLET | Freq: Once | ORAL | Status: DC

## 2023-01-15 MED ORDER — KETOROLAC TROMETHAMINE 30 MG/ML IJ SOLN
30.0000 mg | Freq: Once | INTRAMUSCULAR | Status: AC
  Administered 2023-01-15: 30 mg via INTRAVENOUS
  Filled 2023-01-15: qty 1

## 2023-01-15 MED ORDER — ALBUTEROL SULFATE HFA 108 (90 BASE) MCG/ACT IN AERS: 2.0000 | INHALATION_SPRAY | RESPIRATORY_TRACT | Status: DC | PRN

## 2023-01-15 MED ORDER — POTASSIUM CHLORIDE CRYS ER 20 MEQ PO TBCR
40.0000 meq | EXTENDED_RELEASE_TABLET | Freq: Once | ORAL | Status: AC
  Administered 2023-01-15: 40 meq via ORAL
  Filled 2023-01-15: qty 2

## 2023-01-15 MED ORDER — ACETAMINOPHEN 500 MG PO TABS
1000.0000 mg | ORAL_TABLET | Freq: Once | ORAL | Status: AC
  Administered 2023-01-15: 1000 mg via ORAL
  Filled 2023-01-15: qty 2

## 2023-01-15 NOTE — Discharge Instructions (Signed)
Evaluation was overall reassuring.  However given you are still having intermittent chest pain with sinus arrhythmia recommend that you follow-up with cardiology.  I submitted a ambulatory referral.  Please schedule appointment soon as possible.  If you have worsening chest pain, shortness of breath, tenderness in your calves or any other concerning symptom please return emerged part for further evaluation.

## 2023-01-15 NOTE — ED Provider Notes (Signed)
[facility] Provider Note   CSN: [id number] Arrival date & time: 01/15/23  1452     History  Chief Complaint  Patient presents with   Leg Swelling   HPI Crystal Hanson is a 27 y.o. female presenting for bilateral leg swelling.  Has been intermittent since Saturday.  Also endorsing chest pain that started this morning.  Left side of her chest.  Feels more like palpitations and soreness.  It is nonradiating.  Endorses shortness of breath is worse with exertion and at times lying flat.  States she gets "winded doing simple task".  HPI     Home Medications Prior to Admission medications   Medication Sig Start Date End Date Taking? Authorizing Provider  dicyclomine (BENTYL) 20 MG tablet Take 1 tablet (20 mg total) by mouth 2 (two) times daily. 08/12/18   Joy, Shawn C, PA-C  ondansetron (ZOFRAN ODT) 4 MG disintegrating tablet Take 1 tablet (4 mg total) by mouth every 8 (eight) hours as needed for nausea or vomiting. 08/12/18   Joy, Shawn C, PA-C      Allergies    Patient has no known allergies.    Review of Systems   See HPI for pertinent positives  Physical Exam Updated Vital Signs BP 121/82   Pulse (!) 42   Temp 98.9 F (37.2 C)   Resp (!) 22   Ht 5\' 6"  (1.676 m)   Wt 111.1 kg   SpO2 96%   BMI 39.54 kg/m  Physical Exam Vitals and nursing note reviewed.  HENT:     Head: Normocephalic and atraumatic.     Mouth/Throat:     Mouth: Mucous membranes are moist.  Eyes:     General:        Right eye: No discharge.        Left eye: No discharge.     Conjunctiva/sclera: Conjunctivae normal.  Cardiovascular:     Rate and Rhythm: Normal rate and regular rhythm.     Pulses: Normal pulses.     Heart sounds: Normal heart sounds.  Pulmonary:     Effort: Pulmonary effort is normal.     Breath sounds: Normal breath sounds.  Abdominal:     General: Abdomen is flat.     Palpations: Abdomen is soft.  Musculoskeletal:     Right lower  leg: No edema.     Left lower leg: No edema.     Right ankle: Swelling present.     Left ankle: Swelling present.  Skin:    General: Skin is warm and dry.  Neurological:     General: No focal deficit present.  Psychiatric:        Mood and Affect: Mood normal.     ED Results / Procedures / Treatments   Labs (all labs ordered are listed, but only abnormal results are displayed) Labs Reviewed  COMPREHENSIVE METABOLIC PANEL - Abnormal; Notable for the following components:      Result Value   Potassium 3.4 (*)    CO2 21 (*)    Glucose, Bld 103 (*)    ALT 55 (*)    All other components within normal limits  CBC WITH DIFFERENTIAL/PLATELET  BRAIN NATRIURETIC PEPTIDE  D-DIMER, QUANTITATIVE  MAGNESIUM  PREGNANCY, URINE  TSH  T4, FREE  TROPONIN I (HIGH SENSITIVITY)  TROPONIN I (HIGH SENSITIVITY)    EKG EKG Interpretation Date/Time:  Monday January 15 2023 15:11:57 EDT Ventricular Rate:  76 PR Interval:  150 QRS  Duration:  93 QT Interval:  385 QTC Calculation: 433 R Axis:   23  Text Interpretation: Sinus rhythm Low voltage, precordial leads Borderline T abnormalities, diffuse leads No significant change since last tracing Confirmed by Benjiman Core [phone]) on 01/15/2023 6:13:54 PM  Radiology DG Chest 2 View  Result Date: 01/15/2023 CLINICAL DATA:  shortness of breath EXAM: CHEST - 2 VIEW COMPARISON:  07/16/2022. FINDINGS: Low lung volume. Bilateral lung fields are clear. Bilateral costophrenic angles are clear. Normal cardio-mediastinal silhouette. No acute osseous abnormalities. The soft tissues are within normal limits. IMPRESSION: No active cardiopulmonary disease. Electronically Signed   By: Jules Schick M.D.   On: 01/15/2023 15:36    Procedures Procedures    Medications Ordered in ED Medications  albuterol (VENTOLIN HFA) 108 (90 Base) MCG/ACT inhaler 2 puff (has no administration in time range)  potassium chloride SA (KLOR-CON M) CR tablet 40 mEq (40 mEq Oral  Given 01/15/23 1659)  acetaminophen (TYLENOL) tablet 1,000 mg (1,000 mg Oral Given 01/15/23 1702)  ketorolac (TORADOL) 30 MG/ML injection 30 mg (30 mg Intravenous Given 01/15/23 1819)    ED Course/ Medical Decision Making/ A&P                             Medical Decision Making Amount and/or Complexity of Data Reviewed Labs: ordered. Radiology: ordered.  Risk OTC drugs. Prescription drug management.   Initial Impression and Ddx 27 year old well-appearing female presenting for lower extremity edema.  Exam notable for bilateral ankle nonpitting swelling but otherwise remarkable.  DDx includes CHF exacerbation, ACS, PE, arrhythmia, and renal failure. Patient PMH that increases complexity of ED encounter:  none  Interpretation of Diagnostics - I independent reviewed and interpreted the labs as followed: Hypokalemia  - I independently visualized the following imaging with scope of interpretation limited to determining acute life threatening conditions related to emergency care: cxr, which revealed no acute findings  -I personally reviewed and interpreted EKG which revealed sinus rhythm  Patient Reassessment and Ultimate Disposition/Management On reassessment, patient stated that chest pain still present with palpitations.  ACS workup reassuring.  Did note on telemetry there was evidence of intermittent sinus arrhythmia but otherwise reassuring.  Symptoms are consistent with PE negative D-dimer.  Given the persistence of her chest pain with sinus arrhythmia thought it warranted ambulatory referral to cardiology.  Discussed return precautions.  Vitals stable.  Discharged home in good condition.  Patient management required discussion with the following services or consulting groups:  None  Complexity of Problems Addressed Acute complicated illness or Injury  Additional Data Reviewed and Analyzed Further history obtained from: Further history from spouse/family member, Past medical  history and medications listed in the EMR, and Prior ED visit notes  Patient Encounter Risk Assessment None         Final Clinical Impression(s) / ED Diagnoses Final diagnoses:  Hypokalemia  Palpitations  Atypical chest pain    Rx / DC Orders ED Discharge Orders          Ordered    Ambulatory referral to Cardiology       Comments: If you have not heard from the Cardiology office within the next 72 hours please call [phone].   01/15/23 1904              Gareth Eagle, PA-C 01/15/23 1904    Benjiman Core, MD 01/15/23 [id number]

## 2023-01-15 NOTE — ED Triage Notes (Signed)
Patient presents to ED via POV from home. Here with bilateral leg edema since Saturday. Patient also endorses shortness of breath. Patient gets winded with simple tasks.

## 2023-03-13 DIAGNOSIS — R0789 Other chest pain: Secondary | ICD-10-CM | POA: Insufficient documentation

## 2023-03-13 DIAGNOSIS — R002 Palpitations: Secondary | ICD-10-CM | POA: Insufficient documentation

## 2023-03-13 DIAGNOSIS — J189 Pneumonia, unspecified organism: Secondary | ICD-10-CM | POA: Insufficient documentation

## 2023-03-15 ENCOUNTER — Ambulatory Visit: Payer: Medicaid Other | Admitting: Cardiology
